# Patient Record
Sex: Female | Born: 1935 | Race: White | Hispanic: No | Marital: Married | Smoking: Never smoker
Health system: Southern US, Community
[De-identification: ages and names within clinical notes are randomized; demographics above are authoritative.]

## PROBLEM LIST (undated history)

## (undated) DIAGNOSIS — F0390 Unspecified dementia without behavioral disturbance: Secondary | ICD-10-CM

## (undated) DIAGNOSIS — E039 Hypothyroidism, unspecified: Secondary | ICD-10-CM

## (undated) HISTORY — PX: THYROIDECTOMY: SHX17

## (undated) HISTORY — PX: ABDOMINAL HYSTERECTOMY: SUR658

## (undated) HISTORY — PX: CATARACT EXTRACTION, BILATERAL: SHX1313

---

## 2018-01-26 ENCOUNTER — Emergency Department (HOSPITAL_COMMUNITY): Payer: Medicare Other

## 2018-01-26 ENCOUNTER — Observation Stay (HOSPITAL_COMMUNITY)
Admission: EM | Admit: 2018-01-26 | Discharge: 2018-01-27 | Disposition: A | Discharge status: Home / Self Care | Payer: Medicare Other | Attending: Internal Medicine | Admitting: Internal Medicine

## 2018-01-26 ENCOUNTER — Other Ambulatory Visit: Payer: Self-pay

## 2018-01-26 ENCOUNTER — Encounter (HOSPITAL_COMMUNITY): Payer: Self-pay | Admitting: Emergency Medicine

## 2018-01-26 DIAGNOSIS — M6281 Muscle weakness (generalized): Secondary | ICD-10-CM | POA: Insufficient documentation

## 2018-01-26 DIAGNOSIS — E039 Hypothyroidism, unspecified: Secondary | ICD-10-CM

## 2018-01-26 DIAGNOSIS — R262 Difficulty in walking, not elsewhere classified: Secondary | ICD-10-CM | POA: Diagnosis not present

## 2018-01-26 DIAGNOSIS — F0390 Unspecified dementia without behavioral disturbance: Secondary | ICD-10-CM | POA: Diagnosis not present

## 2018-01-26 DIAGNOSIS — N390 Urinary tract infection, site not specified: Secondary | ICD-10-CM | POA: Insufficient documentation

## 2018-01-26 DIAGNOSIS — Z79899 Other long term (current) drug therapy: Secondary | ICD-10-CM | POA: Insufficient documentation

## 2018-01-26 DIAGNOSIS — Z7982 Long term (current) use of aspirin: Secondary | ICD-10-CM | POA: Insufficient documentation

## 2018-01-26 DIAGNOSIS — W19XXXA Unspecified fall, initial encounter: Secondary | ICD-10-CM | POA: Diagnosis present

## 2018-01-26 DIAGNOSIS — I639 Cerebral infarction, unspecified: Secondary | ICD-10-CM | POA: Diagnosis not present

## 2018-01-26 DIAGNOSIS — R4689 Other symptoms and signs involving appearance and behavior: Secondary | ICD-10-CM | POA: Diagnosis present

## 2018-01-26 DIAGNOSIS — R404 Transient alteration of awareness: Secondary | ICD-10-CM

## 2018-01-26 HISTORY — DX: Unspecified dementia, unspecified severity, without behavioral disturbance, psychotic disturbance, mood disturbance, and anxiety: F03.90

## 2018-01-26 HISTORY — DX: Hypothyroidism, unspecified: E03.9

## 2018-01-26 LAB — CBC
HEMATOCRIT: 46.5 % — AB (ref 36.0–46.0)
HEMOGLOBIN: 15 g/dL (ref 12.0–15.0)
MCH: 28.2 pg (ref 26.0–34.0)
MCHC: 32.3 g/dL (ref 30.0–36.0)
MCV: 87.6 fL (ref 78.0–100.0)
Platelets: 246 10*3/uL (ref 150–400)
RBC: 5.31 MIL/uL — AB (ref 3.87–5.11)
RDW: 14.1 % (ref 11.5–15.5)
WBC: 14 10*3/uL — ABNORMAL HIGH (ref 4.0–10.5)

## 2018-01-26 LAB — RAPID URINE DRUG SCREEN, HOSP PERFORMED
AMPHETAMINES: NOT DETECTED
BENZODIAZEPINES: NOT DETECTED
Barbiturates: NOT DETECTED
Cocaine: NOT DETECTED
Opiates: NOT DETECTED
TETRAHYDROCANNABINOL: NOT DETECTED

## 2018-01-26 LAB — I-STAT VENOUS BLOOD GAS, ED
Acid-Base Excess: 1 mmol/L (ref 0.0–2.0)
BICARBONATE: 25.9 mmol/L (ref 20.0–28.0)
O2 Saturation: 48 %
PCO2 VEN: 41.1 mmHg — AB (ref 44.0–60.0)
PO2 VEN: 26 mmHg — AB (ref 32.0–45.0)
TCO2: 27 mmol/L (ref 22–32)
pH, Ven: 7.408 (ref 7.250–7.430)

## 2018-01-26 LAB — LIPID PANEL
CHOL/HDL RATIO: 3.6 ratio
Cholesterol: 223 mg/dL — ABNORMAL HIGH (ref 0–200)
HDL: 62 mg/dL (ref >40)
LDL Cholesterol: 147 mg/dL — ABNORMAL HIGH (ref 0–99)
Triglycerides: 69 mg/dL (ref <150)
VLDL: 14 mg/dL (ref 0–40)

## 2018-01-26 LAB — URINALYSIS, ROUTINE W REFLEX MICROSCOPIC
Bilirubin Urine: NEGATIVE
GLUCOSE, UA: NEGATIVE mg/dL
Ketones, ur: 5 mg/dL — AB
NITRITE: NEGATIVE
Protein, ur: NEGATIVE mg/dL
SPECIFIC GRAVITY, URINE: 1.005 (ref 1.005–1.030)
pH: 7 (ref 5.0–8.0)

## 2018-01-26 LAB — CBG MONITORING, ED: GLUCOSE-CAPILLARY: 124 mg/dL — AB (ref 70–99)

## 2018-01-26 LAB — I-STAT CHEM 8, ED
BUN: 11 mg/dL (ref 8–23)
CALCIUM ION: 1.12 mmol/L — AB (ref 1.15–1.40)
CREATININE: 0.7 mg/dL (ref 0.44–1.00)
Chloride: 102 mmol/L (ref 98–111)
Glucose, Bld: 123 mg/dL — ABNORMAL HIGH (ref 70–99)
HEMATOCRIT: 45 % (ref 36.0–46.0)
Hemoglobin: 15.3 g/dL — ABNORMAL HIGH (ref 12.0–15.0)
Potassium: 3.1 mmol/L — ABNORMAL LOW (ref 3.5–5.1)
Sodium: 138 mmol/L (ref 135–145)
TCO2: 23 mmol/L (ref 22–32)

## 2018-01-26 LAB — COMPREHENSIVE METABOLIC PANEL
ALK PHOS: 69 U/L (ref 38–126)
ALT: 13 U/L (ref 0–44)
AST: 25 U/L (ref 15–41)
Albumin: 3.4 g/dL — ABNORMAL LOW (ref 3.5–5.0)
Anion gap: 10 (ref 5–15)
BUN: 10 mg/dL (ref 8–23)
CALCIUM: 9.3 mg/dL (ref 8.9–10.3)
CHLORIDE: 103 mmol/L (ref 98–111)
CO2: 27 mmol/L (ref 22–32)
CREATININE: 0.84 mg/dL (ref 0.44–1.00)
GFR calc Af Amer: >60 mL/min (ref >60)
GFR calc non Af Amer: >60 mL/min (ref >60)
GLUCOSE: 131 mg/dL — AB (ref 70–99)
Potassium: 3.2 mmol/L — ABNORMAL LOW (ref 3.5–5.1)
SODIUM: 140 mmol/L (ref 135–145)
Total Bilirubin: 1.1 mg/dL (ref 0.3–1.2)
Total Protein: 7.1 g/dL (ref 6.5–8.1)

## 2018-01-26 LAB — DIFFERENTIAL
ABS IMMATURE GRANULOCYTES: 0.1 10*3/uL (ref 0.0–0.1)
BASOS ABS: 0.1 10*3/uL (ref 0.0–0.1)
Basophils Relative: 1 %
Eosinophils Absolute: 0.1 10*3/uL (ref 0.0–0.7)
Eosinophils Relative: 1 %
Immature Granulocytes: 0 %
Lymphocytes Relative: 13 %
Lymphs Abs: 1.8 10*3/uL (ref 0.7–4.0)
MONO ABS: 1 10*3/uL (ref 0.1–1.0)
Monocytes Relative: 7 %
NEUTROS ABS: 10.9 10*3/uL — AB (ref 1.7–7.7)
Neutrophils Relative %: 78 %

## 2018-01-26 LAB — HEMOGLOBIN A1C
Hgb A1c MFr Bld: 5.4 % (ref 4.8–5.6)
MEAN PLASMA GLUCOSE: 108.28 mg/dL

## 2018-01-26 LAB — PROTIME-INR
INR: 1.01
Prothrombin Time: 13.2 seconds (ref 11.4–15.2)

## 2018-01-26 LAB — I-STAT TROPONIN, ED: Troponin i, poc: 0.05 ng/mL (ref 0.00–0.08)

## 2018-01-26 LAB — AMMONIA: Ammonia: 14 umol/L (ref 9–35)

## 2018-01-26 LAB — I-STAT CG4 LACTIC ACID, ED: Lactic Acid, Venous: 2.04 mmol/L (ref 0.5–1.9)

## 2018-01-26 LAB — APTT: APTT: 23 s — AB (ref 24–36)

## 2018-01-26 LAB — ETHANOL: Alcohol, Ethyl (B): <10 mg/dL (ref <10)

## 2018-01-26 LAB — CK: Total CK: 197 U/L (ref 38–234)

## 2018-01-26 MED ORDER — ACETAMINOPHEN 325 MG PO TABS: 650.0000 mg | ORAL_TABLET | Freq: Four times a day (QID) | ORAL | Status: DC | PRN

## 2018-01-26 MED ORDER — CALCIUM CARB-CHOLECALCIFEROL 600-800 MG-UNIT PO TABS: 1.0000 | ORAL_TABLET | Freq: Two times a day (BID) | ORAL | Status: DC

## 2018-01-26 MED ORDER — ASPIRIN 325 MG PO TABS: 325.0000 mg | ORAL_TABLET | ORAL | Status: DC

## 2018-01-26 MED ORDER — ATORVASTATIN CALCIUM 40 MG PO TABS: 40.0000 mg | ORAL_TABLET | Freq: Every day | ORAL | Status: DC

## 2018-01-26 MED ORDER — SODIUM CHLORIDE 0.9 % IV SOLN
1.0000 g | Freq: Once | INTRAVENOUS | Status: AC
  Administered 2018-01-26: 1 g via INTRAVENOUS
  Filled 2018-01-26: qty 10

## 2018-01-26 MED ORDER — LEVOTHYROXINE SODIUM 75 MCG PO TABS
75.0000 ug | ORAL_TABLET | Freq: Every day | ORAL | Status: DC
  Administered 2018-01-26: 75 ug via ORAL
  Filled 2018-01-26: qty 1

## 2018-01-26 MED ORDER — SODIUM CHLORIDE 0.9% FLUSH
3.0000 mL | Freq: Two times a day (BID) | INTRAVENOUS | Status: DC
  Administered 2018-01-26 – 2018-01-27 (×2): 3 mL via INTRAVENOUS

## 2018-01-26 MED ORDER — HYDRALAZINE HCL 20 MG/ML IJ SOLN: 5.0000 mg | INTRAMUSCULAR | Status: DC | PRN

## 2018-01-26 MED ORDER — ASPIRIN 325 MG PO TABS
325.0000 mg | ORAL_TABLET | Freq: Every day | ORAL | Status: DC
  Administered 2018-01-26 – 2018-01-27 (×2): 325 mg via ORAL
  Filled 2018-01-26 (×2): qty 1

## 2018-01-26 MED ORDER — ENOXAPARIN SODIUM 40 MG/0.4ML ~~LOC~~ SOLN
40.0000 mg | SUBCUTANEOUS | Status: DC
  Administered 2018-01-26: 40 mg via SUBCUTANEOUS
  Filled 2018-01-26: qty 0.4

## 2018-01-26 MED ORDER — SODIUM CHLORIDE 0.9 % IV SOLN: 1.0000 g | INTRAVENOUS | Status: DC

## 2018-01-26 MED ORDER — ONDANSETRON HCL 4 MG PO TABS: 4.0000 mg | ORAL_TABLET | Freq: Four times a day (QID) | ORAL | Status: DC | PRN

## 2018-01-26 MED ORDER — ACETAMINOPHEN 650 MG RE SUPP: 650.0000 mg | Freq: Four times a day (QID) | RECTAL | Status: DC | PRN

## 2018-01-26 MED ORDER — SODIUM CHLORIDE 0.9 % IV BOLUS
1000.0000 mL | Freq: Once | INTRAVENOUS | Status: AC
  Administered 2018-01-26: 1000 mL via INTRAVENOUS

## 2018-01-26 MED ORDER — ONDANSETRON HCL 4 MG/2ML IJ SOLN: 4.0000 mg | Freq: Four times a day (QID) | INTRAMUSCULAR | Status: DC | PRN

## 2018-01-26 MED ORDER — ACETAMINOPHEN 500 MG PO TABS
1000.0000 mg | ORAL_TABLET | Freq: Once | ORAL | Status: AC
  Administered 2018-01-26: 1000 mg via ORAL
  Filled 2018-01-26: qty 2

## 2018-01-26 MED ORDER — CALCIUM CARBONATE-VITAMIN D 500-200 MG-UNIT PO TABS
1.0000 | ORAL_TABLET | Freq: Two times a day (BID) | ORAL | Status: DC
  Administered 2018-01-26 – 2018-01-27 (×2): 1 via ORAL
  Filled 2018-01-26 (×2): qty 1

## 2018-01-26 NOTE — ED Notes (Signed)
Pt returned from MRI °

## 2018-01-26 NOTE — ED Notes (Signed)
Report given to scott RN
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
## 2018-01-26 NOTE — ED Notes (Signed)
Patient transported to MRI 

## 2018-01-26 NOTE — ED Provider Notes (Signed)
[name] EMERGENCY DEPARTMENT Provider Note   CSN: [id number] Arrival date & time: 01/26/18  1426     History   Chief Complaint Chief Complaint  Patient presents with  . Altered Mental Status    HPI Tara Bonilla is a 84 y.o. female.  83 yo F with a chief complaint of altered mental status.  Per the husband she was taking a shower and she fell.  After which she was not responsive to him.  She arrived via EMS.  Was moving all 4 extremities at that time and nonverbal.  Per the husband normally she walks and talks and has conversations.  Has a history of dementia which is really short-term memory loss.  She has been having some right lower back pain but he denies any other significant issues.  Denies infectious symptoms cough congestion fevers chills myalgias vomiting or diarrhea.  She has urinary incontinence at baseline but denies pain with urination.  He denies any unilateral numbness or weakness.  Unsure if she was injured in the fall.  Did not think that she struck her head.    When I evaluated the patient she had return from CT.  She is now verbal, states that nothing is bothering her.  States she thinks that her back hurts she seems somewhat mildly confused.  The history is provided by the patient.  Illness  This is a new problem. The current episode started 3 to 5 hours ago. The problem occurs constantly. The problem has been gradually improving. Pertinent negatives include no chest pain, no headaches and no shortness of breath. Nothing aggravates the symptoms. Nothing relieves the symptoms. She has tried nothing for the symptoms. The treatment provided no relief.    No past medical history on file.  Patient Active Problem List   Diagnosis Date Noted  . Altered behavior 01/26/2018  . Fall 01/26/2018    History reviewed. No pertinent surgical history.   OB History   None      Home Medications    Prior to Admission medications   Medication Sig  Start Date End Date Taking? Authorizing Provider  aspirin 325 MG tablet Take 325 mg by mouth every Monday, Wednesday, and Friday.   Yes [provider]  atenolol (TENORMIN) 50 MG tablet Take 50 mg by mouth 2 (two) times daily. 12/30/17  Yes [provider]  Calcium Carb-Cholecalciferol 600-800 MG-UNIT TABS Take 1 tablet by mouth 2 (two) times daily.   Yes [provider]  levothyroxine (SYNTHROID, LEVOTHROID) 75 MCG tablet Take 75 mcg by mouth at bedtime. 12/30/17  Yes [provider]  Red Yeast Rice Extract 600 MG CAPS Take 1,800 mg by mouth every evening.   Yes [provider]    Family History No family history on file.  Social History Social History   Tobacco Use  . Smoking status: Not on file  Substance Use Topics  . Alcohol use: Not on file  . Drug use: Not on file     Allergies   Patient has no known allergies.   Review of Systems Review of Systems  Constitutional: Negative for chills and fever.  HENT: Negative for congestion and rhinorrhea.   Eyes: Negative for redness and visual disturbance.  Respiratory: Negative for shortness of breath and wheezing.   Cardiovascular: Negative for chest pain and palpitations.  Gastrointestinal: Negative for nausea and vomiting.  Genitourinary: Negative for dysuria and urgency.  Musculoskeletal: Positive for back pain. Negative for arthralgias and myalgias.  Skin: Negative for pallor and wound.  Neurological: Negative for dizziness and headaches.     Physical Exam Updated Vital Signs BP (!) 141/88   Pulse (!) 46   Temp 97.7 F (36.5 C) (Axillary)   Resp 11   SpO2 98%   Physical Exam  Constitutional: She is oriented to person, place, and time. She appears well-developed and well-nourished. No distress.  HENT:  Head: Normocephalic and atraumatic.  Eyes: Pupils are equal, round, and reactive to light. EOM are normal.  Neck: Normal range of motion. Neck supple.  Cardiovascular:  Normal rate and regular rhythm. Exam reveals no gallop and no friction rub.  No murmur heard. Pulmonary/Chest: Effort normal. She has no wheezes. She has no rales.  Abdominal: Soft. She exhibits no distension and no mass. There is no tenderness. There is no guarding.  Musculoskeletal: She exhibits no edema or tenderness.  Neurological: She is alert and oriented to person, place, and time. She has normal strength. No cranial nerve deficit or sensory deficit. She displays a negative Romberg sign. Coordination and gait normal. GCS eye subscore is 4. GCS verbal subscore is 5. GCS motor subscore is 6.  Skin: Skin is warm and dry. She is not diaphoretic.  Psychiatric: She has a normal mood and affect. Her behavior is normal.  Nursing note and vitals reviewed.    ED Treatments / Results  Labs (all labs ordered are listed, but only abnormal results are displayed) Labs Reviewed  APTT - Abnormal; Notable for the following components:      Result Value   aPTT 23 (*)    All other components within normal limits  CBC - Abnormal; Notable for the following components:   WBC 14.0 (*)    RBC 5.31 (*)    HCT 46.5 (*)    All other components within normal limits  DIFFERENTIAL - Abnormal; Notable for the following components:   Neutro Abs 10.9 (*)    All other components within normal limits  COMPREHENSIVE METABOLIC PANEL - Abnormal; Notable for the following components:   Potassium 3.2 (*)    Glucose, Bld 131 (*)    Albumin 3.4 (*)    All other components within normal limits  URINALYSIS, ROUTINE W REFLEX MICROSCOPIC - Abnormal; Notable for the following components:   APPearance HAZY (*)    Hgb urine dipstick MODERATE (*)    Ketones, ur 5 (*)    Leukocytes, UA LARGE (*)    Bacteria, UA MANY (*)    All other components within normal limits  I-STAT CHEM 8, ED - Abnormal; Notable for the following components:   Potassium 3.1 (*)    Glucose, Bld 123 (*)    Calcium, Ion 1.12 (*)    Hemoglobin  15.3 (*)    All other components within normal limits  CBG MONITORING, ED - Abnormal; Notable for the following components:   Glucose-Capillary 124 (*)    All other components within normal limits  I-STAT CG4 LACTIC ACID, ED - Abnormal; Notable for the following components:   Lactic Acid, Venous 2.04 (*)    All other components within normal limits  I-STAT VENOUS BLOOD GAS, ED - Abnormal; Notable for the following components:   pCO2, Ven 41.1 (*)    pO2, Ven 26.0 (*)    All other components within normal limits  URINE CULTURE  PROTIME-INR  RAPID URINE DRUG SCREEN, HOSP PERFORMED  ETHANOL  AMMONIA  CK  I-STAT TROPONIN, ED    EKG EKG Interpretation  Date/Time:  Wednesday January 26 2018 14:33:14 EDT Ventricular Rate:  56 PR Interval:    QRS Duration: 127 QT Interval:  498 QTC Calculation: 481 R Axis:   -61 Text Interpretation:  Sinus rhythm LVH with IVCD, LAD and secondary repol abnrm No old tracing to compare Confirmed by Melene Plan [id number]) on 01/26/2018 3:01:53 PM   Radiology Dg Chest 2 View  Result Date: 01/26/2018 CLINICAL DATA:  Fall EXAM: CHEST - 2 VIEW COMPARISON:  None. FINDINGS: The heart size and mediastinal contours are within normal limits. Both lungs are clear. The visualized skeletal structures are unremarkable. IMPRESSION: No active cardiopulmonary disease. Electronically Signed   By: Deatra Robinson M.D.   On: 01/26/2018 16:22   Ct Head Wo Contrast  Result Date: 01/26/2018 CLINICAL DATA:  Fall EXAM: CT HEAD WITHOUT CONTRAST TECHNIQUE: Contiguous axial images were obtained from the base of the skull through the vertex without intravenous contrast. COMPARISON:  None. FINDINGS: Examination is degraded by motion and streak artifact, obscuring the anterior frontal lobes. Brain: There is no acute hemorrhage or mass effect. Areas of hyperdensity along the anterior left frontal cortex are artifactual due to a combination of motion and streak artifact from the calvarium. The  size and configuration of the ventricles and extra-axial CSF spaces are normal. There is no acute or chronic infarction. There is hypoattenuation of the periventricular white matter, most commonly indicating chronic ischemic microangiopathy. Vascular: Atherosclerotic calcification of the vertebral and internal carotid arteries at the skull base. No abnormal hyperdensity of the major intracranial arteries or dural venous sinuses. Skull: The visualized skull base, calvarium and extracranial soft tissues are normal. Sinuses/Orbits: No fluid levels or advanced mucosal thickening of the visualized paranasal sinuses. No mastoid or middle ear effusion. The orbits are normal. IMPRESSION: Motion degraded examination without acute abnormality. Electronically Signed   By: Deatra Robinson M.D.   On: 01/26/2018 15:16   Mr Brain Wo Contrast  Result Date: 01/26/2018 CLINICAL DATA:  84 y/o F; fall and altered mental status. History of dementia. EXAM: MRI HEAD WITHOUT CONTRAST TECHNIQUE: Multiplanar, multiecho pulse sequences of the brain and surrounding structures were obtained without intravenous contrast. COMPARISON:  01/26/2018 CT head FINDINGS: Brain: Motion degradation on multiple sequences. 2 punctate foci of reduced diffusion are present in the right frontal cortex and right parietal periventricular white matter compatible with acute/early subacute infarctions. No associated hemorrhage or mass effect. No extra-axial collection, hydrocephalus, focal mass effect, or herniation. Patchy nonspecific T2 FLAIR hyperintensities in subcortical and periventricular white matter are compatible with moderate chronic microvascular ischemic changes. Moderate volume loss of the brain. Two punctate foci of susceptibility hypointensity within the parietal lobes indicating hemosiderin deposition from chronic microhemorrhage. Vascular: Normal flow voids. Skull and upper cervical spine: Normal marrow signal. Sinuses/Orbits: Mild mucosal  thickening of left posterior ethmoid air cells. No abnormal signal of the mastoid air cells. Bilateral intra-ocular lens replacement. Other: None. IMPRESSION: 1. Two punctate foci of acute/early subacute small vessel infarction in the right frontal and right parietal lobes. 2. Moderate chronic microvascular ischemic changes and volume loss of the brain. These results were called by telephone at the time of interpretation on 01/26/2018 at 7:57 pm to Dr. Melene Plan , who verbally acknowledged these results. Electronically Signed   By: Mitzi Hansen M.D.   On: 01/26/2018 19:59    Procedures Procedures (including critical care time)  Medications Ordered in ED Medications  cefTRIAXone (ROCEPHIN) 1 g in sodium chloride 0.9 % 100 mL IVPB (1 g  Intravenous New Bag/Given 01/26/18 2050)  sodium chloride 0.9 % bolus 1,000 mL (0 mLs Intravenous Stopped 01/26/18 1740)  acetaminophen (TYLENOL) tablet 1,000 mg (1,000 mg Oral Given 01/26/18 1626)     Initial Impression / Assessment and Plan / ED Course  I have reviewed the triage vital signs and the nursing notes.  Pertinent labs & imaging results that were available during my care of the patient were reviewed by me and considered in my medical decision making (see chart for details).     84 yo F with a cc of AMS.  Patient having some improvement since then.  Will give a L of fluids, labs.  CT of the head without acute ICH as viewed by me.  Will obtain cxr, ua.  ? Seizure with LOC and confusion afterwards.  No hx previously.   I discussed the case with Dr. Amada Jupiter, neurology.  He is concerned that this could be a seizure or a TIA.  He recommended a TIA work-up and stay in the hospital.  Neurology will evaluate the patient.  Will discuss with the hospitalist.    The patients results and plan were reviewed and discussed.   Any x-rays performed were independently reviewed by myself.   Differential diagnosis were considered with the presenting  HPI.  Medications  cefTRIAXone (ROCEPHIN) 1 g in sodium chloride 0.9 % 100 mL IVPB (1 g Intravenous New Bag/Given 01/26/18 2050)  sodium chloride 0.9 % bolus 1,000 mL (0 mLs Intravenous Stopped 01/26/18 1740)  acetaminophen (TYLENOL) tablet 1,000 mg (1,000 mg Oral Given 01/26/18 1626)    Vitals:   01/26/18 1645 01/26/18 1700 01/26/18 1800 01/26/18 1900  BP: (!) 175/97 (!) 157/98 (!) 122/95 (!) 141/88  Pulse: (!) 57 (!) 55 (!) 49 (!) 46  Resp: 19 17 16 11   Temp:      TempSrc:      SpO2: 97% 99% 97% 98%    Final diagnoses:  Transient alteration of awareness  Ischemic stroke (HCC)    Admission/ observation were discussed with the admitting physician, patient and/or family and they are comfortable with the plan.     Final Clinical Impressions(s) / ED Diagnoses   Final diagnoses:  Transient alteration of awareness  Ischemic stroke [facility])    ED Discharge Orders    None       Melene Plan, DO 01/26/18 2056

## 2018-01-26 NOTE — ED Notes (Addendum)
Attempted report and floor said that a nurse has not been assigned this pt yet.

## 2018-01-26 NOTE — Consult Note (Addendum)
Referring Physician: Dr. Tyrone Nine    Chief Complaint: AMS  HPI: Tara Bonilla is an 84 y.o. female who presented to the ED with her husband from a local hotel with AMS. They are here from [place] as part of the evacuation from [place]. They traveled here with other members of their retirement home on a chartered bus. The patient is unable to provide a history due to cognitive/communication deficit. According to her husband, she was taking a shower and she fell. Afterwards, she was not verbally responding to him and was also mute. EMS was called and on arrival to the ED she was moving all 4 extremities, but continued to be nonverbal. According to her husband, normally she walks on her own and is able to carry on a conversation in the context of known chronic short term memory loss. According to her husband, she has not been diagnosed with dementia, however. Of note, she has been having some right lower back pain but husband denies any other significant issues.  She has not had any infectious symptoms.  She is incontinent of urine at baseline. In the ED, after returning from CT, whe was once again able to speak. ED note documents that she was able to state that her back was hurting and that she seemed mildly confused.  She takes ASA 3x per week. She is not on a blood thinner.   MRI brain was obtained, revealing two punctate acute ischemic infarctions within the right cerebral hemisphere. Diffuse central and superficial atrophy as well as chronic small vessel ischemic changes are also noted.    No past medical history on file.   No family history on file. Social History:  has no tobacco, alcohol, and drug history on file.  Allergies: No Known Allergies  Medications:  Prior to Admission:  Medications Prior to Admission  Medication Sig Dispense Refill Last Dose  . aspirin 325 MG tablet Take 325 mg by mouth every Monday, Wednesday, and Friday.   01/24/2018  . atenolol (TENORMIN) 50 MG  tablet Take 50 mg by mouth 2 (two) times daily.   01/26/2018 at 0800  . Calcium Carb-Cholecalciferol 600-800 MG-UNIT TABS Take 1 tablet by mouth 2 (two) times daily.   01/26/2018 at 0800  . levothyroxine (SYNTHROID, LEVOTHROID) 75 MCG tablet Take 75 mcg by mouth at bedtime.   01/25/2018 at Unknown time  . Red Yeast Rice Extract 600 MG CAPS Take 1,800 mg by mouth every evening.   01/25/2018 at Unknown time   Scheduled: . aspirin  325 mg Oral Daily  . atorvastatin  40 mg Oral q1800  . calcium-vitamin D  1 tablet Oral BID  . [START ON 01/28/2018] donepezil  10 mg Oral QHS  . enoxaparin (LOVENOX) injection  40 mg Subcutaneous Q24H  . ketorolac  30 mg Intravenous Once  . levothyroxine  75 mcg Oral QHS  . sodium chloride flush  3 mL Intravenous Q12H   Continuous: . cefTRIAXone (ROCEPHIN)  IV      ROS: Per husband: Intermittent back pain relieved with heating pads and Tylenol. No headache, CP, SOB, abdominal pain, limb weakness.   Physical Examination: Blood pressure (!) 141/88, pulse (!) 46, temperature 97.7 F (36.5 C), temperature source Axillary, resp. rate 11, SpO2 98 %.  HEENT: [place]/AT Lungs: Respirations unlabored Ext: Warm and well perfused  Neurologic Examination: Mental Status: Awake and alert. Receptive and expressive dysphasia with paucity of speech.  Unable to name fingers, a badge or a pen. Able to attempt to  count fingers to command. Follows about 25% of simple motor commands. Not oriented to time or place. No dysarthria noted.  Cranial Nerves: II:  Blinks to threat bilaterally. PERRL.  III,IV, VI: Tracks to left and right with conjugate EOM. No nystagmus.  V,VII: Smile symmetric, reacts to cold object bilaterally  VIII: hearing intact to voice IX,X: No hoarseness or hypophonia noted XI: Head at midline.  XII: midline tongue extension  Motor: Upper extremities with 4/5 grip strength, biceps and triceps bilaterally.  Lower extremities with 4/5 knee extension bilaterally.  Other  than the above, patient unable to further participate in evaluation of other muscle groups due to comprehension deficit.  No asymmetry noted.  Tone normal x 4.  Bulk normal x 4.  Sensory: Reacts to cold object bilateral upper extremities. Moves legs to irritating stimuli bilaterally.  Deep Tendon Reflexes:  1+ bilateral biceps and brachioradialis. Trace patellar reflexes. 0 achilles bilaterally.  Plantars: Right: downgoing   Left: downgoing Cerebellar: Unable to perform due to global aphasia Gait: Deferred    Results for orders placed or performed during the hospital encounter of 01/26/18 (from the past 48 hour(s))  CBG monitoring, ED     Status: Abnormal   Collection Time: 01/26/18  3:17 PM  Result Value Ref Range   Glucose-Capillary 124 (H) 70 - 99 mg/dL   Comment 1 Notify RN    Comment 2 Document in Chart   Protime-INR     Status: None   Collection Time: 01/26/18  3:39 PM  Result Value Ref Range   Prothrombin Time 13.2 11.4 - 15.2 seconds   INR 1.01     Comment: Performed at [facility], [place] [address] [place], [place] [address]  APTT     Status: Abnormal   Collection Time: 01/26/18  3:39 PM  Result Value Ref Range   aPTT 23 (L) 24 - 36 seconds    Comment: Performed at [facility] [address] [place], [place] [address]  CBC     Status: Abnormal   Collection Time: 01/26/18  3:39 PM  Result Value Ref Range   WBC 14.0 (H) 4.0 - 10.5 K/uL   RBC 5.31 (H) 3.87 - 5.11 MIL/uL   Hemoglobin 15.0 12.0 - 15.0 g/dL   HCT 46.5 (H) 36.0 - 46.0 %   MCV 87.6 78.0 - 100.0 fL   MCH 28.2 26.0 - 34.0 pg   MCHC 32.3 30.0 - 36.0 g/dL   RDW 14.1 11.5 - 15.5 %   Platelets 246 150 - 400 K/uL    Comment: Performed at [facility] [address] [place], [place] [address]  Differential     Status: Abnormal   Collection Time: 01/26/18  3:39 PM  Result Value Ref Range   Neutrophils Relative % 78 %   Neutro Abs 10.9 (H) 1.7 - 7.7 K/uL   Lymphocytes Relative 13 %    Lymphs Abs 1.8 0.7 - 4.0 K/uL   Monocytes Relative 7 %   Monocytes Absolute 1.0 0.1 - 1.0 K/uL   Eosinophils Relative 1 %   Eosinophils Absolute 0.1 0.0 - 0.7 K/uL   Basophils Relative 1 %   Basophils Absolute 0.1 0.0 - 0.1 K/uL   Immature Granulocytes 0 %   Abs Immature Granulocytes 0.1 0.0 - 0.1 K/uL    Comment: Performed at [facility] [address] [place], [place] [address]  Comprehensive metabolic panel     Status: Abnormal  Collection Time: 01/26/18  3:39 PM  Result Value Ref Range   Sodium 140 135 - 145 mmol/L   Potassium 3.2 (L) 3.5 - 5.1 mmol/L   Chloride 103 98 - 111 mmol/L   CO2 27 22 - 32 mmol/L   Glucose, Bld 131 (H) 70 - 99 mg/dL   BUN 10 8 - 23 mg/dL   Creatinine, Ser 0.84 0.44 - 1.00 mg/dL   Calcium 9.3 8.9 - 10.3 mg/dL   Total Protein 7.1 6.5 - 8.1 g/dL   Albumin 3.4 (L) 3.5 - 5.0 g/dL   AST 25 15 - 41 U/L   ALT 13 0 - 44 U/L   Alkaline Phosphatase 69 38 - 126 U/L   Total Bilirubin 1.1 0.3 - 1.2 mg/dL   GFR calc non Af Amer >60 >60 mL/min   GFR calc Af Amer >60 >60 mL/min    Comment: (NOTE) The eGFR has been calculated using the CKD EPI equation. This calculation has not been validated in all clinical situations. eGFR's persistently <60 mL/min signify possible Chronic Kidney Disease.    Anion gap 10 5 - 15    Comment: Performed at [facility] [address], [place], [place] [address]  Ethanol     Status: None   Collection Time: 01/26/18  3:39 PM  Result Value Ref Range   Alcohol, Ethyl (B) <10 <10 mg/dL    Comment: (NOTE) Lowest detectable limit for serum alcohol is 10 mg/dL. For medical purposes only. Performed at [facility], [place] [address], [place], [place] [address]   Ammonia     Status: None   Collection Time: 01/26/18  3:39 PM  Result Value Ref Range   Ammonia 14 9 - 35 umol/L    Comment: Performed at [facility], [place] [address] [place], [place] [address]  I-stat troponin, ED     Status: None   Collection  Time: 01/26/18  3:48 PM  Result Value Ref Range   Troponin i, poc 0.05 0.00 - 0.08 ng/mL   Comment 3            Comment: Due to the release kinetics of cTnI, a negative result within the first hours of the onset of symptoms does not rule out myocardial infarction with certainty. If myocardial infarction is still suspected, repeat the test at appropriate intervals.   I-Stat venous blood gas, ED     Status: Abnormal   Collection Time: 01/26/18  3:48 PM  Result Value Ref Range   pH, Ven 7.408 7.250 - 7.430   pCO2, Ven 41.1 (L) 44.0 - 60.0 mmHg   pO2, Ven 26.0 (LL) 32.0 - 45.0 mmHg   Bicarbonate 25.9 20.0 - 28.0 mmol/L   TCO2 27 22 - 32 mmol/L   O2 Saturation 48.0 %   Acid-Base Excess 1.0 0.0 - 2.0 mmol/L   Patient temperature HIDE    Sample type VENOUS    Comment NOTIFIED PHYSICIAN   I-Stat Chem 8, ED     Status: Abnormal   Collection Time: 01/26/18  3:50 PM  Result Value Ref Range   Sodium 138 135 - 145 mmol/L   Potassium 3.1 (L) 3.5 - 5.1 mmol/L   Chloride 102 98 - 111 mmol/L   BUN 11 8 - 23 mg/dL   Creatinine, Ser 0.70 0.44 - 1.00 mg/dL   Glucose, Bld 123 (H) 70 - 99 mg/dL   Calcium, Ion 1.12 (L) 1.15 - 1.40 mmol/L   TCO2 23 22 -  32 mmol/L   Hemoglobin 15.3 (H) 12.0 - 15.0 g/dL   HCT 45.0 36.0 - 46.0 %  I-Stat CG4 Lactic Acid, ED     Status: Abnormal   Collection Time: 01/26/18  3:51 PM  Result Value Ref Range   Lactic Acid, Venous 2.04 (HH) 0.5 - 1.9 mmol/L   Comment NOTIFIED PHYSICIAN   Urine rapid drug screen (hosp performed)     Status: None   Collection Time: 01/26/18  6:48 PM  Result Value Ref Range   Opiates NONE DETECTED NONE DETECTED   Cocaine NONE DETECTED NONE DETECTED   Benzodiazepines NONE DETECTED NONE DETECTED   Amphetamines NONE DETECTED NONE DETECTED   Tetrahydrocannabinol NONE DETECTED NONE DETECTED   Barbiturates NONE DETECTED NONE DETECTED    Comment: (NOTE) DRUG SCREEN FOR MEDICAL PURPOSES ONLY.  IF CONFIRMATION IS NEEDED FOR ANY PURPOSE,  NOTIFY LAB WITHIN 5 DAYS. LOWEST DETECTABLE LIMITS FOR URINE DRUG SCREEN Drug Class                     Cutoff (ng/mL) Amphetamine and metabolites    1000 Barbiturate and metabolites    200 Benzodiazepine                 397 Tricyclics and metabolites     300 Opiates and metabolites        300 Cocaine and metabolites        300 THC                            50 Performed at [facility], [place] [address], [place], [place] [address]   Urinalysis, Routine w reflex microscopic     Status: Abnormal   Collection Time: 01/26/18  6:48 PM  Result Value Ref Range   Color, Urine YELLOW YELLOW   APPearance HAZY (A) CLEAR   Specific Gravity, Urine 1.005 1.005 - 1.030   pH 7.0 5.0 - 8.0   Glucose, UA NEGATIVE NEGATIVE mg/dL   Hgb urine dipstick MODERATE (A) NEGATIVE   Bilirubin Urine NEGATIVE NEGATIVE   Ketones, ur 5 (A) NEGATIVE mg/dL   Protein, ur NEGATIVE NEGATIVE mg/dL   Nitrite NEGATIVE NEGATIVE   Leukocytes, UA LARGE (A) NEGATIVE   RBC / HPF 0-5 0 - 5 RBC/hpf   WBC, UA 21-50 0 - 5 WBC/hpf   Bacteria, UA MANY (A) NONE SEEN   Squamous Epithelial / LPF 0-5 0 - 5   Mucus PRESENT     Comment: Performed at [facility], [place] [address], [place], [place] [address]   Dg Chest 2 View  Result Date: 01/26/2018 CLINICAL DATA:  Fall EXAM: CHEST - 2 VIEW COMPARISON:  None. FINDINGS: The heart size and mediastinal contours are within normal limits. Both lungs are clear. The visualized skeletal structures are unremarkable. IMPRESSION: No active cardiopulmonary disease. Electronically Signed   By: Ulyses Jarred M.D.   On: 01/26/2018 16:22   Ct Head Wo Contrast  Result Date: 01/26/2018 CLINICAL DATA:  Fall EXAM: CT HEAD WITHOUT CONTRAST TECHNIQUE: Contiguous axial images were obtained from the base of the skull through the vertex without intravenous contrast. COMPARISON:  None. FINDINGS: Examination is degraded by motion and streak artifact, obscuring the anterior frontal lobes. Brain: There  is no acute hemorrhage or mass effect. Areas of hyperdensity along the anterior left frontal cortex are artifactual due to a combination of motion and streak artifact from the calvarium. The  size and configuration of the ventricles and extra-axial CSF spaces are normal. There is no acute or chronic infarction. There is hypoattenuation of the periventricular white matter, most commonly indicating chronic ischemic microangiopathy. Vascular: Atherosclerotic calcification of the vertebral and internal carotid arteries at the skull base. No abnormal hyperdensity of the major intracranial arteries or dural venous sinuses. Skull: The visualized skull base, calvarium and extracranial soft tissues are normal. Sinuses/Orbits: No fluid levels or advanced mucosal thickening of the visualized paranasal sinuses. No mastoid or middle ear effusion. The orbits are normal. IMPRESSION: Motion degraded examination without acute abnormality. Electronically Signed   By: Ulyses Jarred M.D.   On: 01/26/2018 15:16   Mr Brain Wo Contrast  Result Date: 01/26/2018 CLINICAL DATA:  84 y/o F; fall and altered mental status. History of dementia. EXAM: MRI HEAD WITHOUT CONTRAST TECHNIQUE: Multiplanar, multiecho pulse sequences of the brain and surrounding structures were obtained without intravenous contrast. COMPARISON:  01/26/2018 CT head FINDINGS: Brain: Motion degradation on multiple sequences. 2 punctate foci of reduced diffusion are present in the right frontal cortex and right parietal periventricular white matter compatible with acute/early subacute infarctions. No associated hemorrhage or mass effect. No extra-axial collection, hydrocephalus, focal mass effect, or herniation. Patchy nonspecific T2 FLAIR hyperintensities in subcortical and periventricular white matter are compatible with moderate chronic microvascular ischemic changes. Moderate volume loss of the brain. Two punctate foci of susceptibility hypointensity within the  parietal lobes indicating hemosiderin deposition from chronic microhemorrhage. Vascular: Normal flow voids. Skull and upper cervical spine: Normal marrow signal. Sinuses/Orbits: Mild mucosal thickening of left posterior ethmoid air cells. No abnormal signal of the mastoid air cells. Bilateral intra-ocular lens replacement. Other: None. IMPRESSION: 1. Two punctate foci of acute/early subacute small vessel infarction in the right frontal and right parietal lobes. 2. Moderate chronic microvascular ischemic changes and volume loss of the brain. These results were called by telephone at the time of interpretation on 01/26/2018 at 7:57 pm to Dr. Deno Etienne , who verbally acknowledged these results. Electronically Signed   By: Kristine Garbe M.D.   On: 01/26/2018 19:59    Assessment: 84 y.o. female presenting with global dysphasia after a fall 1. MRI revealed two punctate right hemisphere ischemic infarctions. The infarctions alone are insufficient to explain her intermittent aphasia, which is most likely due to an infection (has leukocytosis) in conjunction with probable dementia. 2. Fall. Has had lower back pain, but this was present prior to the fall and tends to be relieved with Tylenol, per husband.  3. Bacteriuria. Was afebrile so there is low suspicion for UTI.  4. Leukocytosis. More likely due to an infection than stroke, in my opinion. Possible infection should be further worked up.  5. EKG shows no atrial fibrillation.  6. Stroke Risk Factors - HTN and age 31. Hypothyroidism.   Plan: 1. HgbA1c, fasting lipid panel 2. MRA of the brain without contrast 3. PT consult, OT consult, Speech consult 4. Echocardiogram 5. Carotid dopplers 6. ASA 325 mg po qd 7. Risk factor modification 8. Telemetry monitoring 9. Frequent neuro checks 10. BP management per standard protocol. The punctate infarctions seen on MRI are unlikely to have a significant underperfused penumbra.  11. Infectious work up.   12. TSH level 13. An outpatient dementia evaluation for Mrs. Frady at [facility], upon returning to [place], was recommended to the patient's husband.  14. Consider a trial of donepezil, 5 mg po qhs    @Electronically  signed: Dr. Kerney Elbe 01/26/2018, 8:35 PM

## 2018-01-26 NOTE — ED Provider Notes (Signed)
MSE was initiated and I personally evaluated the patient and placed orders (if any) at  2:53 PM on January 26, 2018.  The patient appears stable so that the remainder of the MSE may be completed by another provider.  Patient was in the shower at 9 AM today when she had an acute change in her mental status.  This was accompanied with a fall.  Patient has a history of dementia at baseline but is able to hold conversation.  After the fall she was unable to speak and follow directions.  Hotel staff was called to assist patient up out of the tub.  When symptoms did not improve after lunch today, husband called EMS.  No previous history of stroke.  Patient does have a history of hypertension and thyroid issues.  Patient seen by myself and initial labs ordered.  She may have receptive/expressive aphasia.  She moves all extremities purposefully and I do not detect any weakness however exam is difficult with her current deficit.  No code stroke called because patient is now 5 and half hours since last seen normal.  I called CT to ask if they would take the patient over as soon as possible and they will help facilitate study. As no definite weakness, no LVO+.   BP (!) 155/113 (BP Location: Right Arm)   Pulse (!) 56   Temp 97.7 F (36.5 C) (Axillary)   Resp 14   SpO2 100%     Renne Crigler, PA-C 01/26/18 1638    Melene Plan, DO 01/26/18 2056

## 2018-01-26 NOTE — ED Triage Notes (Signed)
Patient presents to the ED with husband states this morning about 0900, patient fell in the in shower. Husband denies any head trauma. Reports patient normally alert and oriented x3 able to hold conversation but now is unable to hold a conversation. Patient has history of dementia. Patient husband states patient is on the thinner.   Vitals 182/92 HR58 99% RA

## 2018-01-26 NOTE — ED Notes (Signed)
PureWick placed on pt. 

## 2018-01-27 ENCOUNTER — Observation Stay (HOSPITAL_BASED_OUTPATIENT_CLINIC_OR_DEPARTMENT_OTHER): Payer: Medicare Other

## 2018-01-27 DIAGNOSIS — I639 Cerebral infarction, unspecified: Secondary | ICD-10-CM | POA: Diagnosis not present

## 2018-01-27 DIAGNOSIS — I361 Nonrheumatic tricuspid (valve) insufficiency: Secondary | ICD-10-CM

## 2018-01-27 LAB — TSH: TSH: 1.121 u[IU]/mL (ref 0.350–4.500)

## 2018-01-27 LAB — ECHOCARDIOGRAM COMPLETE
HEIGHTINCHES: 66 in
Weight: 3104.0768 oz

## 2018-01-27 MED ORDER — DONEPEZIL HCL 10 MG PO TABS: 10.0000 mg | ORAL_TABLET | Freq: Every day | ORAL | 0 refills | Status: DC

## 2018-01-27 MED ORDER — ATORVASTATIN CALCIUM 40 MG PO TABS: 40.0000 mg | ORAL_TABLET | Freq: Every day | ORAL | 2 refills | Status: AC

## 2018-01-27 MED ORDER — KETOROLAC TROMETHAMINE 15 MG/ML IJ SOLN
30.0000 mg | Freq: Once | INTRAMUSCULAR | Status: AC
  Administered 2018-01-27: 30 mg via INTRAVENOUS
  Filled 2018-01-27: qty 2

## 2018-01-27 MED ORDER — DONEPEZIL HCL 10 MG PO TABS: 10.0000 mg | ORAL_TABLET | Freq: Every day | ORAL | Status: DC

## 2018-01-27 MED ORDER — HYDRALAZINE HCL 20 MG/ML IJ SOLN: 5.0000 mg | Freq: Three times a day (TID) | INTRAMUSCULAR | Status: DC | PRN

## 2018-01-27 MED ORDER — ACETAMINOPHEN 160 MG/5ML PO SOLN
650.0000 mg | Freq: Four times a day (QID) | ORAL | Status: DC | PRN
  Filled 2018-01-27: qty 20.3

## 2018-01-27 MED ORDER — ASPIRIN 325 MG PO TABS: 325.0000 mg | ORAL_TABLET | Freq: Every day | ORAL | Status: AC

## 2018-01-27 MED ORDER — ATORVASTATIN CALCIUM 40 MG PO TABS: 40.0000 mg | ORAL_TABLET | Freq: Every day | ORAL | 2 refills | Status: DC

## 2018-01-27 MED ORDER — DONEPEZIL HCL 10 MG PO TABS: 10.0000 mg | ORAL_TABLET | Freq: Every day | ORAL | 0 refills | Status: AC

## 2018-01-27 MED ORDER — ATENOLOL 50 MG PO TABS: ORAL_TABLET | ORAL | Status: AC
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
## 2018-01-27 NOTE — Evaluation (Signed)
SLP Cancellation Note  Patient Details Name: Tara Bonilla MRN: [id number] DOB: June 18, 1926   Cancelled treatment:       Reason Eval/Treat Not Completed: Other (comment)(received order for swallow eval but pt passed RNSSS, please reorder SLP if desire- informed RN)   Chales Abrahams 01/27/2018, 10:09 AM   Donavan Burnet, MS [employer] SLP [phone]

## 2018-01-27 NOTE — Progress Notes (Addendum)
Subjective: Patient is demented and has no complaints.  Per husband she is back to baseline.  Exam: Vitals:   01/27/18 0400 01/27/18 0755  BP: (!) 147/67 (!) 151/83  Pulse: (!) 55 66  Resp: 20 20  Temp: (!) 97.5 F (36.4 C) 98.4 F (36.9 C)  SpO2: 97% 95%    Physical Exam   HEENT-  Normocephalic, no lesions, without obvious abnormality.  Normal external eye and conjunctiva.   Musculoskeletal-no joint tenderness, deformity or swelling Skin-warm and dry, no hyperpigmentation, vitiligo, or suspicious lesions    Neuro:  Mental Status: Alert, patient did not know what city she is in, did not know the month, did not know her age but was able to look over in recall her husband was sitting next to her.  She was also able to follow commands without difficulty. Cranial Nerves: II:  Visual fields grossly normal,  III,IV, VI: ptosis not present, extra-ocular motions intact bilaterally pupils equal, round, reactive to light and accommodation V,VII: smile symmetric, facial light touch sensation normal bilaterally VIII: hearing normal bilaterally IX,X: uvula rises midline XI: bilateral shoulder shrug XII: midline tongue extension Motor: Moving all extremities antigravity with good strength. Sensory: Pinprick and light touch intact throughout, bilaterally Deep Tendon Reflexes: 1+ in the upper extremities no ankle jerk and knee jerk Plantars: Right: downgoing   Left: downgoing Cerebellar: normal finger-to-nose,     Medications:  Scheduled: . aspirin  325 mg Oral Daily  . atorvastatin  40 mg Oral q1800  . calcium-vitamin D  1 tablet Oral BID  . [START ON 01/28/2018] donepezil  10 mg Oral QHS  . enoxaparin (LOVENOX) injection  40 mg Subcutaneous Q24H  . levothyroxine  75 mcg Oral QHS  . sodium chloride flush  3 mL Intravenous Q12H   Continuous: -Received 1 g of Rocephin in the ED for suspected UTI  Pertinent Labs/Diagnostics: -UA shows many bacteria and leukocytes. - HbA1c  5.4 - LDL 147 given stroke would like to be seen less then 70 -TSH 1.121  Mr Brain Wo Contrast  Result Date: 01/26/2018  IMPRESSION: 1. Two punctate foci of acute/early subacute small vessel infarction in the right frontal and right parietal lobes. 2. Moderate chronic microvascular ischemic changes and volume loss of the brain. These results were called by telephone at the time of interpretation on 01/26/2018 at 7:57 pm to Dr. Melene Plan , who verbally acknowledged these results. Electronically Signed   By: Mitzi Hansen M.D.   On: 01/26/2018 19:59   -Echo and carotid Dopplers pending  Felicie Morn PA-C [facility] [phone]   Assessment: 84 year old female status post fall with aphasia.  I suspect that that was due to transient ischemic attack.  The right sided infarct seen on MRI I suspect are incidental but together strongly suggestive of embolic disease.  Her work-up has been remarkable for possible vegetation on echocardiogram.  Unfortunately, the patient is very eager to be discharged as her bus to her nursing home in [place] is leaving tomorrow and she has no other mode of transport.    Recommendations: I would discuss urgency of work-up for vegetation with cardiology, would work up here, if not worked up urgently on return to [place] Antiplatelet therapy LDL goal < 70   Ritta Slot, MD [facility] [phone]  If 7pm- 7am, please page neurology on call as listed in AMION.  01/27/2018, 10:30 AM
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
## 2018-01-27 NOTE — Plan of Care (Signed)
Mod-max assist with adls
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
## 2018-01-27 NOTE — Progress Notes (Signed)
Bilateral carotid duplex completed. Preliminary results - 1% to 39% ICA stenosis. Vertebral artery flow is antegrade. [employer], RVS 01/27/2018 11:53 AM
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
## 2018-01-27 NOTE — Progress Notes (Signed)
Subjective:  Ms. Gerda Diss reported feeling well today. Examined at bedside AAOx1 to self. States slept well overnight w/o interruptions. Denies any new focal deficits. Denies headaches, lightheadedness, dizziness, changes in vision, SOB, chest pounding or tightness. Husband states she has intermittent lower extremity muscle spasms when she is upset but she has not had those symptoms during admission. Has not been able to walk around at all as she is deemed fall risk. Requesting that she be discharged to catch the bus back to [place]. Lives at independent living facility.    Objective:  Vital signs in last 24 hours: Vitals:   01/27/18 0200 01/27/18 0400 01/27/18 0450 01/27/18 0755  BP: (!) 179/94 (!) 147/67  (!) 151/83  Pulse: (!) 52 (!) 55  66  Resp: 16 20  20   Temp: 98 F (36.7 C) (!) 97.5 F (36.4 C)  98.4 F (36.9 C)  TempSrc: Oral Oral  Oral  SpO2: 98% 97%  95%  Weight:   88 kg   Height:   5\' 6"  (1.676 m)    Physical Exam  Constitutional: She is well-developed, well-nourished, and in no distress.  Pulmonary/Chest: Effort normal and breath sounds normal. No respiratory distress. She has no wheezes. She has no rales.  Abdominal: Soft. Bowel sounds are normal.  Cardiovascular: RRR, right upper sternal border murmur appreciated, no rubs, no gallops Extremities: trace bilateral lower extremity edema  Neurological: alert and oriented only to person, not oriented to place or time, CN III-XII intact, bilateral UE and LE sensation intact, strength 5/5 bilaterally UE and LE   Assessment/Plan:  Principal Problem:   CVA (cerebral vascular accident) [facility]) Active Problems:   Fall   Hypothyroidism (acquired)   Dementia  Mrs. Rothwell is an 84 year-old female with history of HTN, HLD, memory impairment, and surgical hypothyroidism who presents with AMS after a fall. Upon arrival to the ED she was afebrile and hemodynamically stable. She was initially nonverbal and moving all four  extremities, but upon reassessment she was answering questions appropriately despite orientation to self only. Brain MRI showed two punctate foci of acute/early subacute small vessel infarction in the right frontal and right parietal lobes consistent with acute CVA.  CVA Patient is alert and oriented only to person; this is her baseline. No focal neurological deficits on exam. Echo complete, results pending. Leukocytosis to 14 likely secondary to CVA. She remains afebrile. PT recommending HH PT. SLP cancelled, patient passed RNSSS.  - Tele - f/u ECHO and carotid dopplers - continue Aspirin 325 mg QD and atorvastatin 40 mg  - permissive HTN - q2hr neuro checks  Fall Patient reported lower back pain has improved on tylenol. No other injuries reported. - Fall precautions  Bacteriuria Urinary incontinence at baseline. Received 1g ceftriaxone in the ED. Low suspicion for UTI given afebrile and  no reported symptoms, and acute rather than gradual decline. Leukocytosis to 14 is more likely from CVA than infection. - No further antibiotics at this time - Monitor for signs/symptoms of infection  HTN 147/67. Permissive HTN due to CVA. - Hold home atenolol - Hydralazine 5 mg IV q4hrs PRN for sBP > 220 or dBP > 110  Lower Back Pain Patient has had lower back pain for two days since bus ride to [facility]. The pain resolved with Tylenol.  - Tylenol PRN for pain  Dementia - Continue home donepezil 10 mg qhs  Hypothyroidism TSH wnl. - Continue home synthroid QD  Diet:regular DVT ppx: [place] Lovenox Code  status:DNR  Dispo: Patient requesting to be discharged today in order to catch a bus back to [facility] tomorrow morning. Pending echo, carotid dopplers and PT/OT evaluations.   Jaci Standard, DO 01/27/2018, 8:09 AM Pager: [phone]

## 2018-01-27 NOTE — Discharge Summary (Signed)
Name: Tara Bonilla MRN: [id number] DOB: 09-Apr-1927 84 y.o. PCP: System, Pcp Not In  Date of Admission: 01/26/2018  2:26 PM Date of Discharge: 01/27/2018 Attending Physician: Burns Spain, MD  Discharge Diagnosis: 1. CVA  2. Bacteruria  3. Dementia 4. Possible vegetation on echocardiogram   Discharge Medications: Allergies as of 01/27/2018   No Known Allergies     Medication List    TAKE these medications   aspirin 325 MG tablet Take 1 tablet (325 mg total) by mouth daily. What changed:  when to take this   atenolol 50 MG tablet Commonly known as:  TENORMIN Hold atenolol for 3 days. Resume atenolol on 01/30/18 What changed:    how much to take  how to take this  when to take this  additional instructions   atorvastatin 40 MG tablet Commonly known as:  LIPITOR Take 1 tablet (40 mg total) by mouth daily at 6 PM.   Calcium Carb-Cholecalciferol 600-800 MG-UNIT Tabs Take 1 tablet by mouth 2 (two) times daily.   donepezil 10 MG tablet Commonly known as:  ARICEPT Take 1 tablet (10 mg total) by mouth at bedtime. Start taking on:  01/28/2018   levothyroxine 75 MCG tablet Commonly known as:  SYNTHROID, LEVOTHROID Take 75 mcg by mouth at bedtime.   Red Yeast Rice Extract 600 MG Caps Take 1,800 mg by mouth every evening.            Durable Medical Equipment  (From admission, onward)         Start     Ordered   01/27/18 1022  For home use only DME 4 wheeled rolling walker with seat  Once    Question:  Patient needs a walker to treat with the following condition  Answer:  Stroke [facility])   01/27/18 1022          Disposition and follow-up:   Tara Bonilla was discharged from [facility] in Stable condition.  At the hospital follow up visit please address:  1.  Echocardiogram findings: recommended patient have a TEE completed to further evaluate possible mitral valve vegetation  2.  Labs / imaging needed at time of follow-up:  TEE  3.  Pending labs/ test needing follow-up: Urine culture   Follow-up Appointments:   Patient to follow up with PCP asap.   Hospital Course by problem list: 1. CVA- Patient presented with AMS after a fall. On arrival to the ED she was afebrile and hemodynamically stable, nonverbal, moving all four extremities. She recovered shortly after and returned to her baseline per her husband. Brain MRI showed two punctate foci of acute/early subacute small vessel infarction in the right frontal and right parietal lobes consistent with acute CVA. No focal neurological deficits. Carotid doppler showed 1%-39% ICA stenosis.  2. Echocardiogram findings, possible vegetation- recommend TEE. Patient had a bus to catch tomorrow morning back to [facility] and did not want to stay in the hospital. Recommended going to the hospital in [facility] as soon as arriving tomorrow. Presented with leukocytosis of 14, urine culture still pending. 3. Bacteruria- UA showed moderate Hb, 5 ketones, large leukocytes and many bacteria. Patient denied any urinary symptoms. Urinary incontinence at baseline. Received ceftriaxone x1 in the ED. Low suspicion for UTI, afebrile and no abdominal pain.  4. Fall- No injuries sustained, presented with LBP that was present prior to fall. Responded well to tylenol. Head CT negative for bleed.  5. Dementia- continued donepezil 10 mg.  Discharge Vitals:  BP 124/74 (BP Location: Right Arm)   Pulse (!) 57   Temp 98.6 F (37 C) (Oral)   Resp 20   Ht 5\' 6"  (1.676 m)   Wt 88 kg   SpO2 96%   BMI 31.31 kg/m   Pertinent Labs, Studies, and Procedures:    CBC Latest Ref Rng & Units 01/26/2018 01/26/2018  WBC 4.0 - 10.5 K/uL - 14.0(H)  Hemoglobin 12.0 - 15.0 g/dL 15.3(H) 15.0  Hematocrit 36.0 - 46.0 % 45.0 46.5(H)  Platelets 150 - 400 K/uL - 246   CMP Latest Ref Rng & Units 01/26/2018 01/26/2018  Glucose 70 - 99 mg/dL 269(S) 854(O)  BUN 8 - 23 mg/dL 11 10  Creatinine 2.70 - 1.00 mg/dL 3.50 0.93  Sodium 818 -  145 mmol/L 138 140  Potassium 3.5 - 5.1 mmol/L 3.1(L) 3.2(L)  Chloride 98 - 111 mmol/L 102 103  CO2 22 - 32 mmol/L - 27  Calcium 8.9 - 10.3 mg/dL - 9.3  Total Protein 6.5 - 8.1 g/dL - 7.1  Total Bilirubin 0.3 - 1.2 mg/dL - 1.1  Alkaline Phos 38 - 126 U/L - 69  AST 15 - 41 U/L - 25  ALT 0 - 44 U/L - 13   Echocardiogram 01/27/18 Study Conclusions  - Left ventricle: The cavity size was normal. There was moderate   concentric hypertrophy. Systolic function was normal. The   estimated ejection fraction was in the range of 60% to 65%. Wall   motion was normal; there were no regional wall motion   abnormalities. Doppler parameters are consistent with abnormal   left ventricular relaxation (grade 1 diastolic dysfunction).   Doppler parameters are consistent with elevated ventricular   end-diastolic filling pressure. - Aortic valve: There was mild stenosis. Mean gradient (S): 12 mm   Hg. Peak gradient (S): 21 mm Hg. Valve area (VTI): 1.95 cm^2.   Valve area (Vmax): 1.89 cm^2. Valve area (Vmean): 1.66 cm^2. - Mitral valve: Calcified annulus. There was no regurgitation. - Right ventricle: Systolic function was normal. - Right atrium: The atrium was normal in size. - Tricuspid valve: There was mild regurgitation. - Pulmonary arteries: Systolic pressure was mildly to moderately   increased. PA peak pressure: 41 mm Hg (S). - Inferior vena cava: The vessel was normal in size. - Pericardium, extracardiac: There was no pericardial effusion.  Impressions:  - There is a highly mobile, highly echogenic echodensity measuring   8 x 6 mm attached to the anterior portion of the mitral annulus.   A TEE is recommended for further evaluation. This can possibly   represent a detached portion of significant mitral annular   calcification, a vegetation can&'t be excluded.  Dg Chest 2 View  Result Date: 01/26/2018 CLINICAL DATA:  Fall EXAM: CHEST - 2 VIEW COMPARISON:  None. FINDINGS: The heart size and  mediastinal contours are within normal limits. Both lungs are clear. The visualized skeletal structures are unremarkable. IMPRESSION: No active cardiopulmonary disease. Electronically Signed   By: Deatra Robinson M.D.   On: 01/26/2018 16:22   Ct Head Wo Contrast  Result Date: 01/26/2018 CLINICAL DATA:  Fall EXAM: CT HEAD WITHOUT CONTRAST TECHNIQUE: Contiguous axial images were obtained from the base of the skull through the vertex without intravenous contrast. COMPARISON:  None. FINDINGS: Examination is degraded by motion and streak artifact, obscuring the anterior frontal lobes. Brain: There is no acute hemorrhage or mass effect. Areas of hyperdensity along the anterior left frontal cortex are artifactual due to a  combination of motion and streak artifact from the calvarium. The size and configuration of the ventricles and extra-axial CSF spaces are normal. There is no acute or chronic infarction. There is hypoattenuation of the periventricular white matter, most commonly indicating chronic ischemic microangiopathy. Vascular: Atherosclerotic calcification of the vertebral and internal carotid arteries at the skull base. No abnormal hyperdensity of the major intracranial arteries or dural venous sinuses. Skull: The visualized skull base, calvarium and extracranial soft tissues are normal. Sinuses/Orbits: No fluid levels or advanced mucosal thickening of the visualized paranasal sinuses. No mastoid or middle ear effusion. The orbits are normal. IMPRESSION: Motion degraded examination without acute abnormality. Electronically Signed   By: Deatra Robinson M.D.   On: 01/26/2018 15:16   Mr Brain Wo Contrast  Result Date: 01/26/2018 CLINICAL DATA:  84 y/o F; fall and altered mental status. History of dementia. EXAM: MRI HEAD WITHOUT CONTRAST TECHNIQUE: Multiplanar, multiecho pulse sequences of the brain and surrounding structures were obtained without intravenous contrast. COMPARISON:  01/26/2018 CT head FINDINGS:  Brain: Motion degradation on multiple sequences. 2 punctate foci of reduced diffusion are present in the right frontal cortex and right parietal periventricular white matter compatible with acute/early subacute infarctions. No associated hemorrhage or mass effect. No extra-axial collection, hydrocephalus, focal mass effect, or herniation. Patchy nonspecific T2 FLAIR hyperintensities in subcortical and periventricular white matter are compatible with moderate chronic microvascular ischemic changes. Moderate volume loss of the brain. Two punctate foci of susceptibility hypointensity within the parietal lobes indicating hemosiderin deposition from chronic microhemorrhage. Vascular: Normal flow voids. Skull and upper cervical spine: Normal marrow signal. Sinuses/Orbits: Mild mucosal thickening of left posterior ethmoid air cells. No abnormal signal of the mastoid air cells. Bilateral intra-ocular lens replacement. Other: None. IMPRESSION: 1. Two punctate foci of acute/early subacute small vessel infarction in the right frontal and right parietal lobes. 2. Moderate chronic microvascular ischemic changes and volume loss of the brain. These results were called by telephone at the time of interpretation on 01/26/2018 at 7:57 pm to Dr. Melene Plan , who verbally acknowledged these results. Electronically Signed   By: Mitzi Hansen M.D.   On: 01/26/2018 19:59    Discharge Instructions: Discharge Instructions    Diet - low sodium heart healthy   Complete by:  As directed    Discharge instructions   Complete by:  As directed    Tara Bonilla,  Please note the following changes to your medications:   -START taking Lipitor 40 mg once a day -START taking donepezil 10 mg once a day at bedtime -START taking aspirin 325 mg once day  -HOLD atenolol for 3 days, resume taking it on 01/30/18 -Continue to take tylenol 650 mg every 6 hours as needed for your back pain  We recommend you to follow up with the nearest  hospital in [place] once you return tomorrow. Your echocardiogram is concerning for a possible heart valve vegetation. We recommend further workup and evaluation with a type of imaging called aTEE to better visualize your heart and determine if this is truly a vegetation. Please closely follow up with your primary care physician as well.  Thank you for allowing [place] to be a part of your care!   Increase activity slowly   Complete by:  As directed       Signed: Zyionna Pesce, Callie Fielding, DO 01/27/2018, 5:13 PM   Pager: [phone]

## 2018-01-27 NOTE — Progress Notes (Signed)
  Echocardiogram 2D Echocardiogram has been performed.  Celene Skeen 01/27/2018, 11:16 AM

## 2018-01-29 LAB — URINE CULTURE: Culture: >=100000 — AB

## 2018-02-07 ENCOUNTER — Other Ambulatory Visit: Payer: Self-pay

## 2018-02-07 NOTE — Patient Outreach (Signed)
[facility]) Care Management  02/07/2018  Tara Bonilla 1927-04-01 [id number]     EMMI-STROKE RED ON EMMI ALERT Day # 9 Date: 02/06/18 Red Alert Reason: " Sad, hopeless, anxious, or empty? Yes"   Outreach attempt # 1 to patient. A female identified himself as patient's spouse and requested call be completed with him. Advised him that due to HIPAA rules and no DPR on file RN CM would need to obtain a verbal consent from patient to discuss patient's medical info with him. He abruptly and fiercely stated "that will never be happening" and hung up the phone.      Plan: RN CM will close case as this time.    [name], RN,BSN,CCM [facility] Care Management Telephonic Care Management Coordinator Direct Phone: [phone] Toll Free: [phone] Fax: [phone]

## 2019-12-17 IMAGING — MR MR HEAD W/O CM
9 of 10 series · 37 of 48 positions shown · non-contrast
Comparison: 01/26/2018 CT head

CLINICAL DATA: 89 y/o F; fall and altered mental status. History of
dementia.

EXAM:
MRI HEAD WITHOUT CONTRAST
TECHNIQUE: Multiplanar, multiecho pulse sequences of the brain and surrounding
structures were obtained without intravenous contrast.

[Series 3: DWI · axial · 3.0mm · 0.94mm/px · z∈[-82,+61]mm · 9 of 98 slices shown (1 of 2)]
[im 1/98]
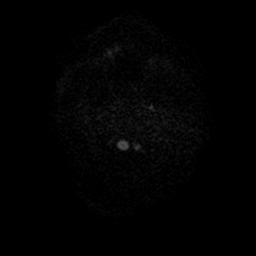
[im 13/98]
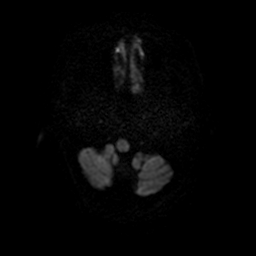
[im 25/98]
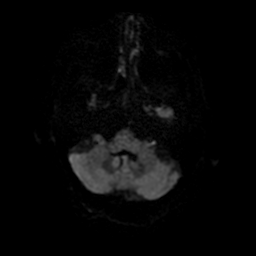
[im 37/98]
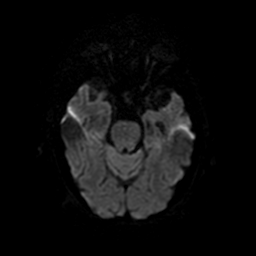
[im 49/98]
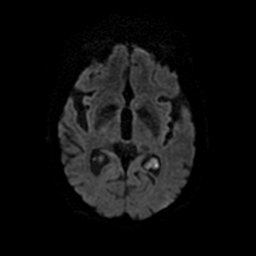
[im 61/98]
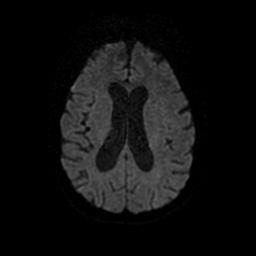
[im 73/98]
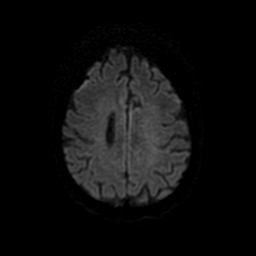
[im 85/98]
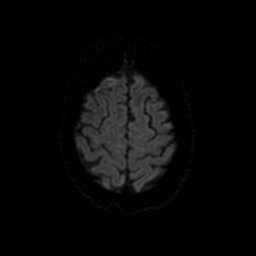
[im 98/98]
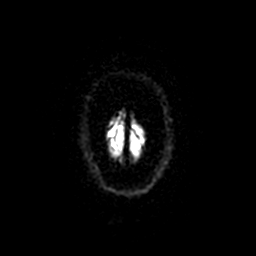

[Series 4: FLAIR · axial · 3.0mm · 0.94mm/px · z∈[-82,+61]mm · 2 of 25 slices shown (1 of 2)]
[im 1/25]
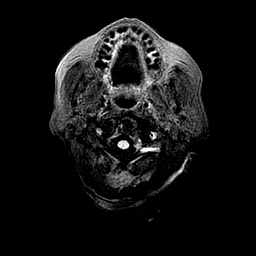
[im 25/25]
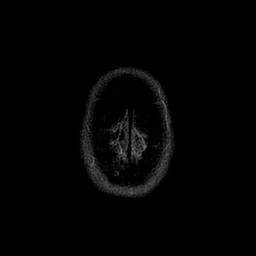

[Series 5: (person_name) · axial · 3.0mm · 0.47mm/px · z∈[-83,-18]mm · 4 of 100 slices shown]
[im 1/100]
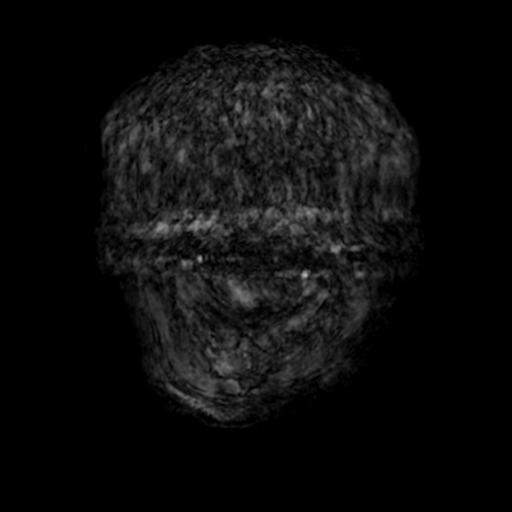
[im 12/100]
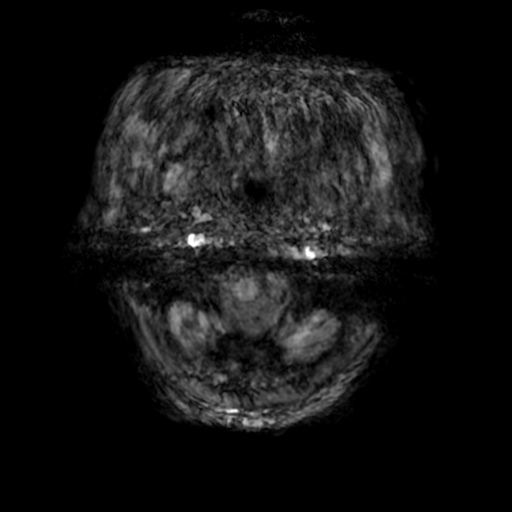
[im 34/100]
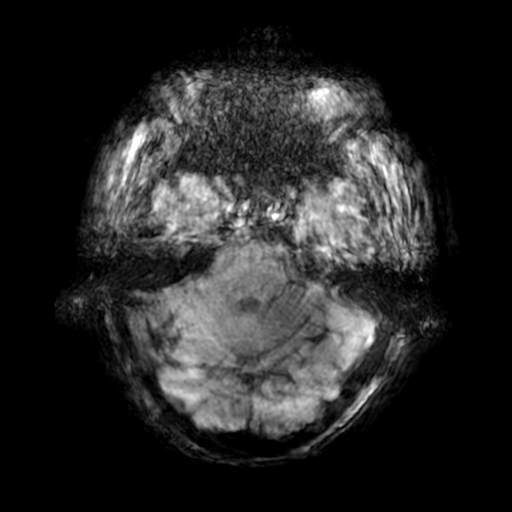
[im 45/100]
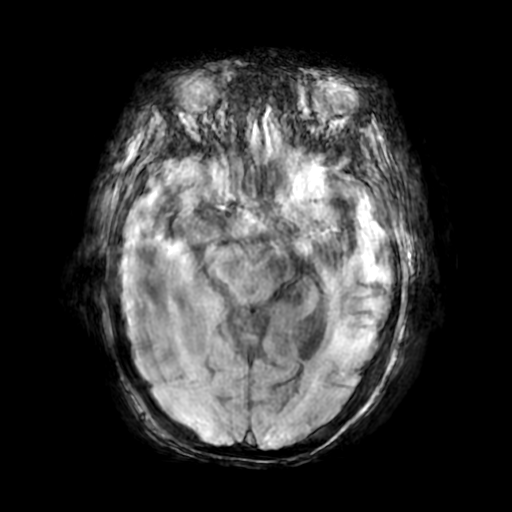

[Series 7: T2 · axial · 5.0mm · 0.47mm/px · z∈[-82,+61]mm · 2 of 25 slices shown (1 of 2)]
[im 1/25]
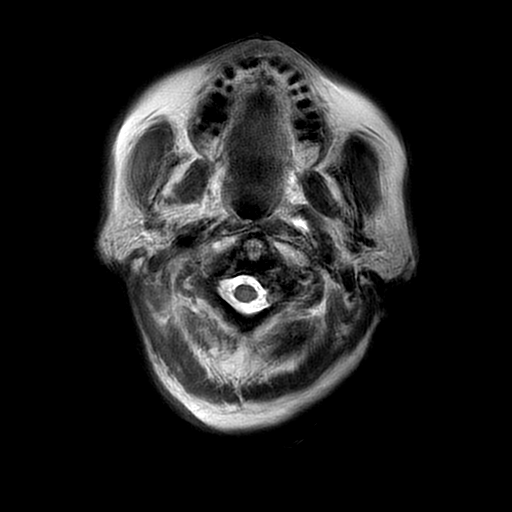
[im 25/25]
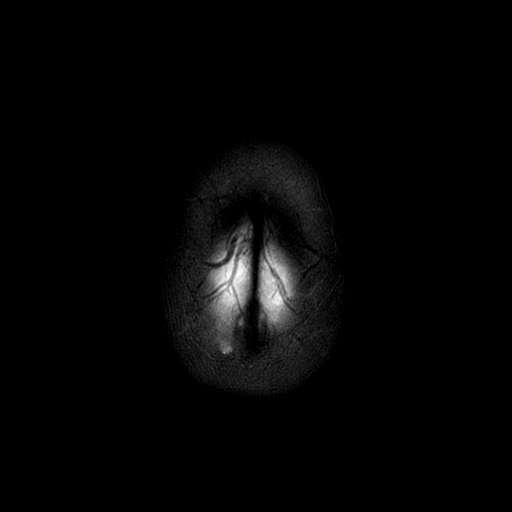

[Series 8: DWI · coronal · 4.0mm · 0.94mm/px · 7 of 70 slices shown (2 of 2)]
[im 1/70]
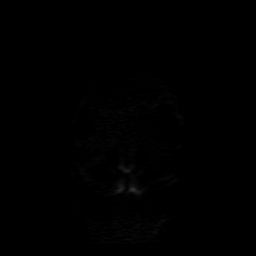
[im 12/70]
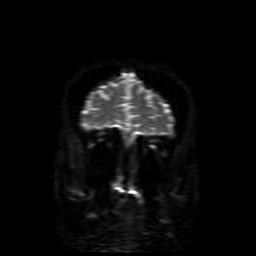
[im 24/70]
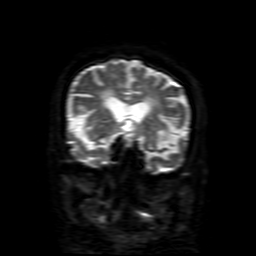
[im 35/70]
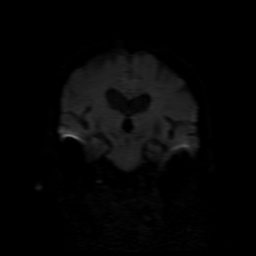
[im 47/70]
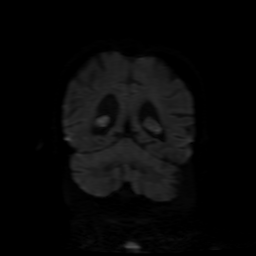
[im 58/70]
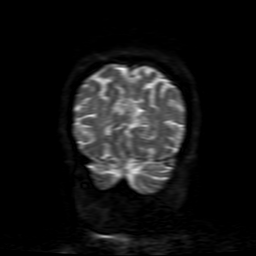
[im 70/70]
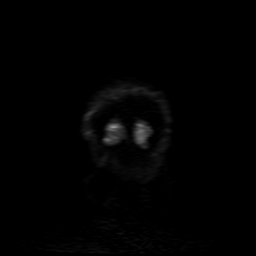

[Series 9: FLAIR · sagittal · 5.0mm · 0.47mm/px · 2 of 23 slices shown (2 of 2)]
[im 1/23]
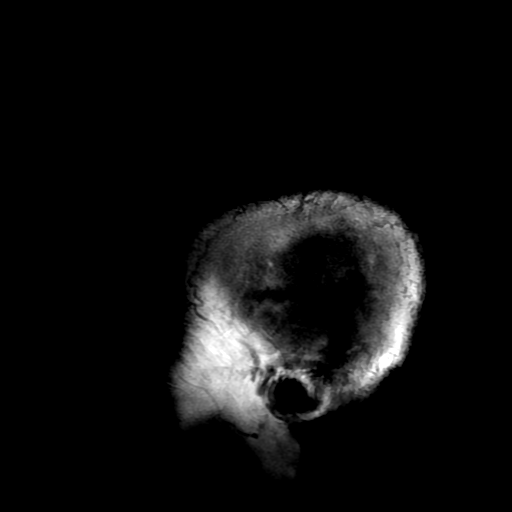
[im 23/23]
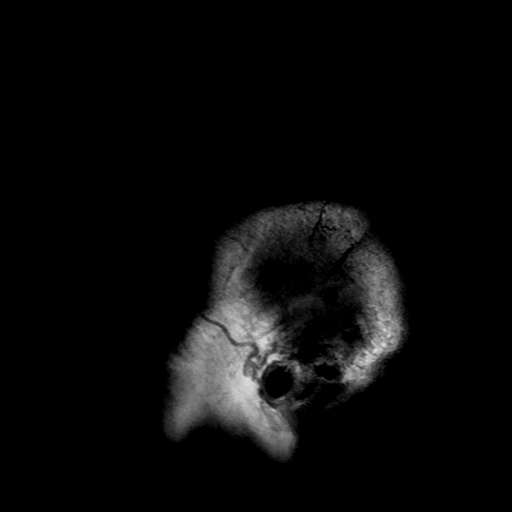

[Series 11: T2 · coronal · 5.0mm · 0.43mm/px · 3 of 29 slices shown (2 of 2)]
[im 1/29]
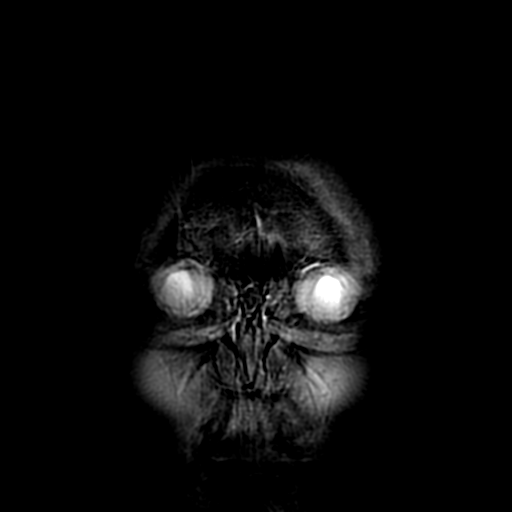
[im 15/29]
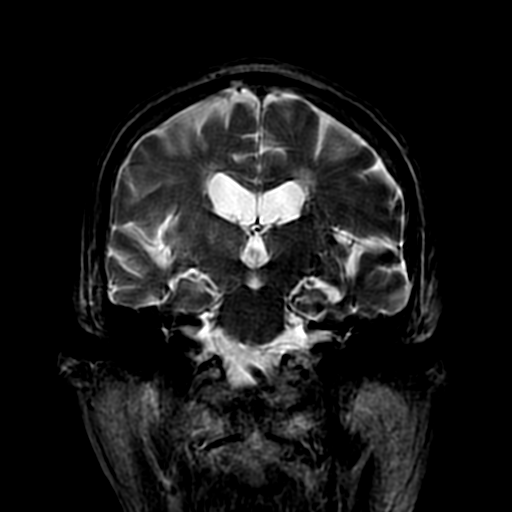
[im 29/29]
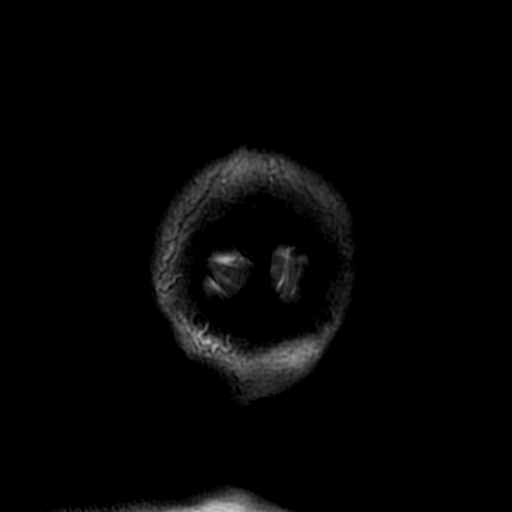

[Series 350: ADC · axial · 3.0mm · 0.94mm/px · z∈[-82,+61]mm · 5 of 49 slices shown (1 of 2)]
[im 1/49]
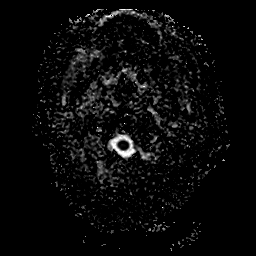
[im 13/49]
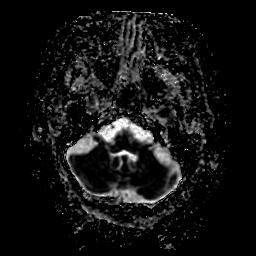
[im 25/49]
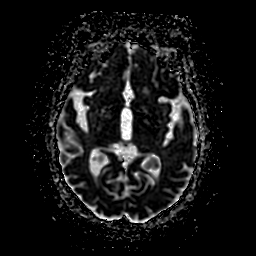
[im 37/49]
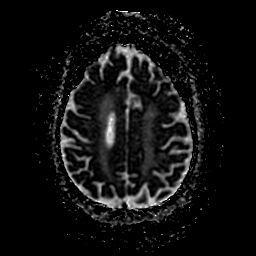
[im 49/49]
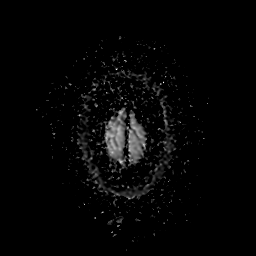

[Series 850: ADC · coronal · 4.0mm · 0.94mm/px · 3 of 35 slices shown (2 of 2)]
[im 1/35]
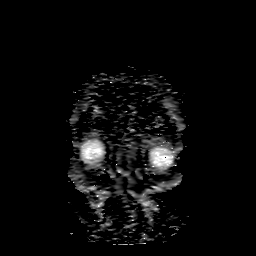
[im 18/35]
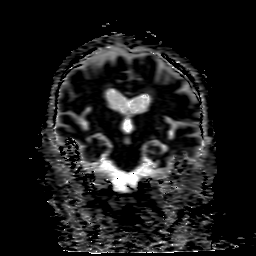
[im 35/35]
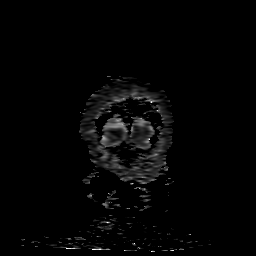

[37 of 48 positions shown; findings below may reference images not displayed]

FINDINGS: Brain: Motion degradation on multiple sequences.

2 punctate foci of reduced diffusion are present in the right
frontal cortex and right parietal periventricular white matter
compatible with acute/early subacute infarctions. No associated
hemorrhage or mass effect.

No extra-axial collection, hydrocephalus, focal mass effect, or
herniation. Patchy nonspecific T2 FLAIR hyperintensities in
subcortical and periventricular white matter are compatible with
moderate chronic microvascular ischemic changes. Moderate volume
loss of the brain. Two punctate foci of susceptibility hypointensity
within the parietal lobes indicating hemosiderin deposition from
chronic microhemorrhage.

Vascular: Normal flow voids.

Skull and upper cervical spine: Normal marrow signal.

Sinuses/Orbits: Mild mucosal thickening of left posterior ethmoid
air cells. No abnormal signal of the mastoid air cells. Bilateral
intra-ocular lens replacement.

Other: None.
IMPRESSION: 1. Two punctate foci of acute/early subacute small vessel infarction
in the right frontal and right parietal lobes.
2. Moderate chronic microvascular ischemic changes and volume loss
of the brain.

These results were called by telephone at the time of interpretation
on 01/26/2018 at [DATE] to Dr. MESSIA PRUDE , who verbally acknowledged
these results.

By: Hagayo Macrouma M.D.
